# Patient Record
Sex: Male | Born: 1995 | Race: White | Hispanic: No | Marital: Single | Smoking: Current every day smoker
Health system: Southern US, Community
[De-identification: ages and names within clinical notes are randomized; demographics above are authoritative.]

## PROBLEM LIST (undated history)

## (undated) DIAGNOSIS — F1010 Alcohol abuse, uncomplicated: Secondary | ICD-10-CM

## (undated) DIAGNOSIS — F329 Major depressive disorder, single episode, unspecified: Secondary | ICD-10-CM

## (undated) DIAGNOSIS — F32A Depression, unspecified: Secondary | ICD-10-CM

## (undated) HISTORY — PX: TESTICLE SURGERY: SHX794

## (undated) HISTORY — PX: EYE SURGERY: SHX253

---
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
## 2020-11-20 ENCOUNTER — Ambulatory Visit (INDEPENDENT_AMBULATORY_CARE_PROVIDER_SITE_OTHER): Payer: 59

## 2020-11-20 ENCOUNTER — Other Ambulatory Visit: Payer: Self-pay

## 2020-11-20 ENCOUNTER — Ambulatory Visit: Payer: 59 | Admitting: Podiatry

## 2020-11-20 ENCOUNTER — Encounter: Payer: Self-pay | Admitting: Podiatry

## 2020-11-20 DIAGNOSIS — S99921A Unspecified injury of right foot, initial encounter: Secondary | ICD-10-CM

## 2020-11-20 DIAGNOSIS — S92354A Nondisplaced fracture of fifth metatarsal bone, right foot, initial encounter for closed fracture: Secondary | ICD-10-CM | POA: Diagnosis not present

## 2020-11-20 NOTE — Progress Notes (Signed)
Subjective:   Patient ID: Travis Mason, male   DOB: 25 y.o.   MRN: [id number]   HPI Patient presents stating he injured his right ankle 1 week ago and it swollen and sore and he is concerned about fracture and is wearing a boot but states it is hard for him to walk and is using crutches.  Patient does smoke approximate quarter pack per day and likes to be active if possible   Review of Systems  All other systems reviewed and are negative.      Objective:  Physical Exam Vitals and nursing note reviewed.  Constitutional:      Appearance: He is well-developed.  Pulmonary:     Effort: Pulmonary effort is normal.  Musculoskeletal:        General: Normal range of motion.  Skin:    General: Skin is warm.  Neurological:     Mental Status: He is alert.    Neurovascular status intact muscle strength found to be adequate range of motion adequate with exquisite discomfort base of fifth metatarsal right with swelling bruising and inflammation in the area.  Very tender with palpation getting slightly better with boot and immobilization but still very painful     Assessment:  Problem ability for fracture of the base of the fifth metatarsal right     Plan:  H&P x-ray reviewed and I went ahead today advised on compression elevation and do not think surgical be necessary based on what I see so far on x-ray  X-ray indicates that there is a slight avulsion fracture base of fifth metatarsal right localized no other pathology noted

## 2020-11-29 ENCOUNTER — Other Ambulatory Visit: Payer: Self-pay | Admitting: Podiatry

## 2020-11-29 DIAGNOSIS — S92354A Nondisplaced fracture of fifth metatarsal bone, right foot, initial encounter for closed fracture: Secondary | ICD-10-CM
